# Patient Record
Sex: Female | Born: 1975 | Race: White | Hispanic: No | Marital: Married | Smoking: Never smoker
Health system: Southern US, Community
[De-identification: ages and names within clinical notes are randomized; demographics above are authoritative.]

## PROBLEM LIST (undated history)

## (undated) HISTORY — PX: CHOLECYSTECTOMY: SHX55

---

## 1997-07-28 ENCOUNTER — Other Ambulatory Visit: Admission: RE | Admit: 1997-07-28 | Discharge: 1997-07-28 | Payer: Self-pay | Admitting: Obstetrics and Gynecology

## 1998-12-19 ENCOUNTER — Other Ambulatory Visit: Admission: RE | Admit: 1998-12-19 | Discharge: 1998-12-19 | Payer: Self-pay | Admitting: Obstetrics and Gynecology

## 1999-01-18 ENCOUNTER — Encounter (INDEPENDENT_AMBULATORY_CARE_PROVIDER_SITE_OTHER): Payer: Self-pay | Admitting: Specialist

## 1999-01-18 ENCOUNTER — Other Ambulatory Visit: Admission: RE | Admit: 1999-01-18 | Discharge: 1999-01-18 | Payer: Self-pay | Admitting: Obstetrics and Gynecology

## 1999-06-20 ENCOUNTER — Other Ambulatory Visit: Admission: RE | Admit: 1999-06-20 | Discharge: 1999-06-20 | Payer: Self-pay | Admitting: Obstetrics and Gynecology

## 1999-10-08 DIAGNOSIS — K219 Gastro-esophageal reflux disease without esophagitis: Secondary | ICD-10-CM

## 1999-10-08 DIAGNOSIS — Z9189 Other specified personal risk factors, not elsewhere classified: Secondary | ICD-10-CM | POA: Insufficient documentation

## 2000-09-30 ENCOUNTER — Inpatient Hospital Stay (HOSPITAL_COMMUNITY): Admission: AD | Admit: 2000-09-30 | Discharge: 2000-10-02 | Payer: Self-pay | Admitting: Obstetrics and Gynecology

## 2000-10-05 ENCOUNTER — Encounter: Admission: RE | Admit: 2000-10-05 | Discharge: 2000-10-14 | Payer: Self-pay | Admitting: Obstetrics and Gynecology

## 2000-11-02 ENCOUNTER — Other Ambulatory Visit: Admission: RE | Admit: 2000-11-02 | Discharge: 2000-11-02 | Payer: Self-pay | Admitting: Obstetrics and Gynecology

## 2002-04-05 ENCOUNTER — Inpatient Hospital Stay (HOSPITAL_COMMUNITY): Admission: AD | Admit: 2002-04-05 | Discharge: 2002-04-07 | Payer: Self-pay | Admitting: Obstetrics and Gynecology

## 2002-05-05 ENCOUNTER — Other Ambulatory Visit: Admission: RE | Admit: 2002-05-05 | Discharge: 2002-05-05 | Payer: Self-pay | Admitting: Obstetrics and Gynecology

## 2003-06-21 ENCOUNTER — Other Ambulatory Visit: Admission: RE | Admit: 2003-06-21 | Discharge: 2003-06-21 | Payer: Self-pay | Admitting: Obstetrics and Gynecology

## 2004-07-30 ENCOUNTER — Other Ambulatory Visit: Admission: RE | Admit: 2004-07-30 | Discharge: 2004-07-30 | Payer: Self-pay | Admitting: Obstetrics and Gynecology

## 2005-09-23 ENCOUNTER — Ambulatory Visit (HOSPITAL_COMMUNITY): Admission: RE | Admit: 2005-09-23 | Discharge: 2005-09-23 | Payer: Self-pay | Admitting: Obstetrics and Gynecology

## 2006-01-13 ENCOUNTER — Inpatient Hospital Stay (HOSPITAL_COMMUNITY): Admission: AD | Admit: 2006-01-13 | Discharge: 2006-01-13 | Payer: Self-pay | Admitting: Obstetrics and Gynecology

## 2006-01-14 ENCOUNTER — Inpatient Hospital Stay (HOSPITAL_COMMUNITY): Admission: AD | Admit: 2006-01-14 | Discharge: 2006-01-14 | Payer: Self-pay | Admitting: Obstetrics and Gynecology

## 2006-01-30 ENCOUNTER — Inpatient Hospital Stay (HOSPITAL_COMMUNITY): Admission: AD | Admit: 2006-01-30 | Discharge: 2006-02-02 | Payer: Self-pay | Admitting: Obstetrics and Gynecology

## 2006-01-31 ENCOUNTER — Encounter (INDEPENDENT_AMBULATORY_CARE_PROVIDER_SITE_OTHER): Payer: Self-pay | Admitting: *Deleted

## 2006-02-03 ENCOUNTER — Encounter: Admission: RE | Admit: 2006-02-03 | Discharge: 2006-03-05 | Payer: Self-pay | Admitting: Obstetrics and Gynecology

## 2007-05-29 IMAGING — US US OB DETAIL+14 WK
1 series · 13 of 28 positions shown · non-contrast
Comparison: none

CLINICAL DATA: 15 week 3 day assigned gestational age by office ultrasound.  Probable cystic hygroma noted.  Evaluate fetal anatomy.

[Series 1: us ob detail+14 wk · 0.25mm/px · 13 of 100 slices shown]
[im 4/100]
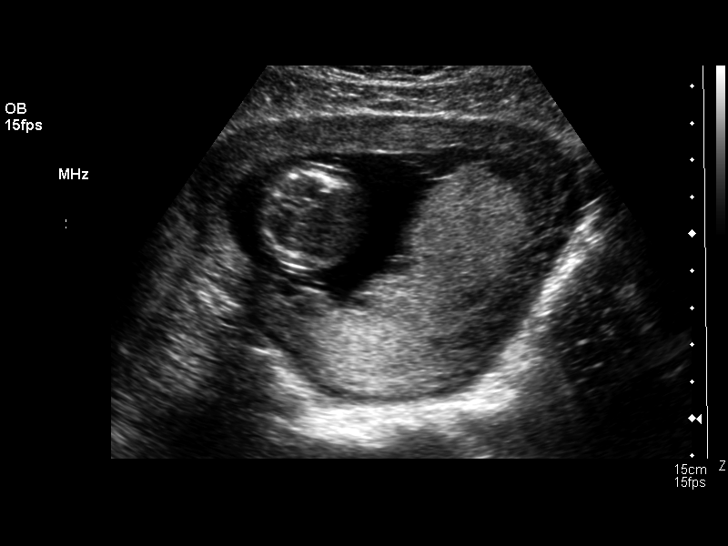
[im 12/100]
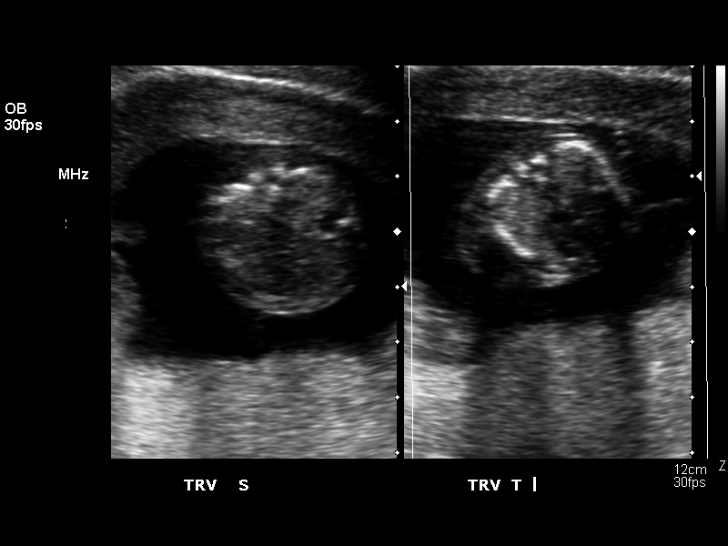
[im 19/100]
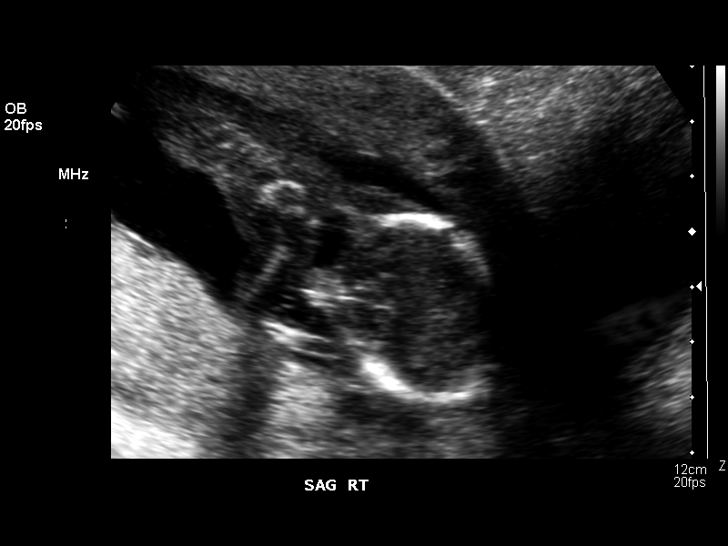
[im 26/100]
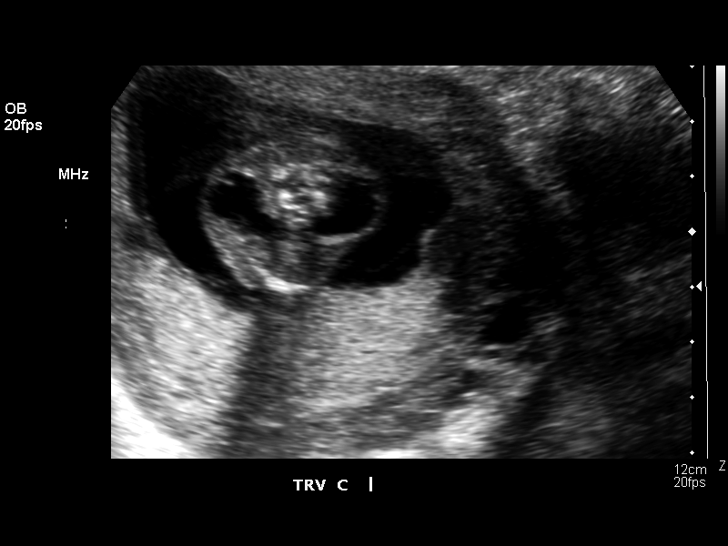
[im 34/100]
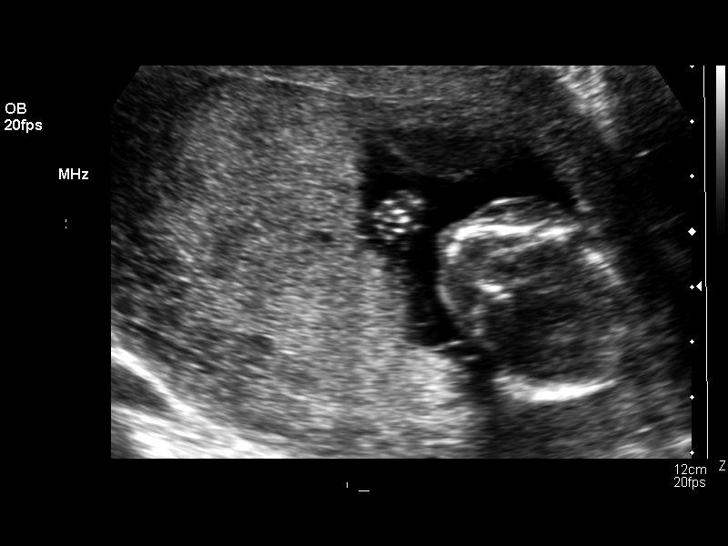
[im 41/100]
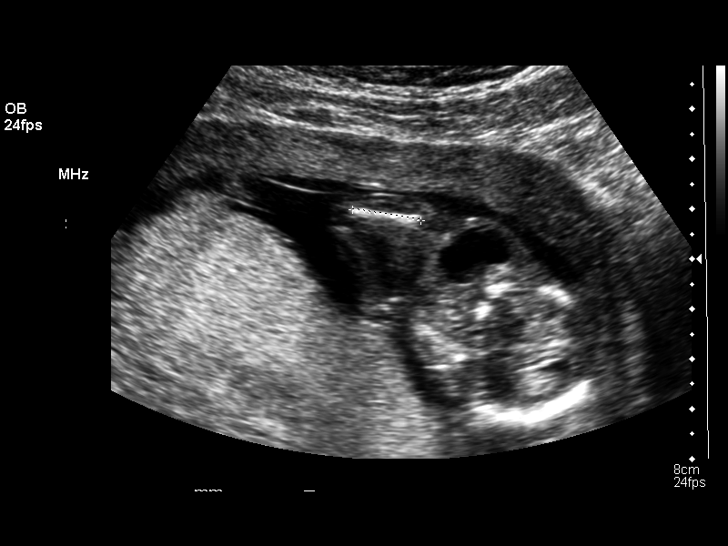
[im 52/100]
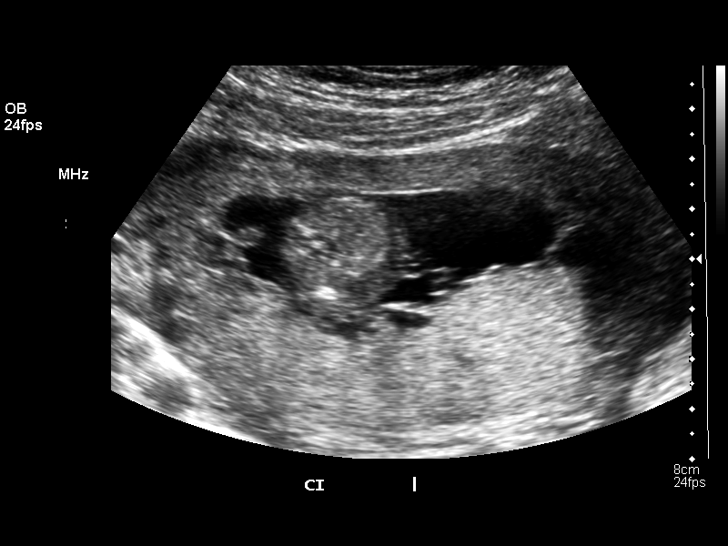
[im 59/100]
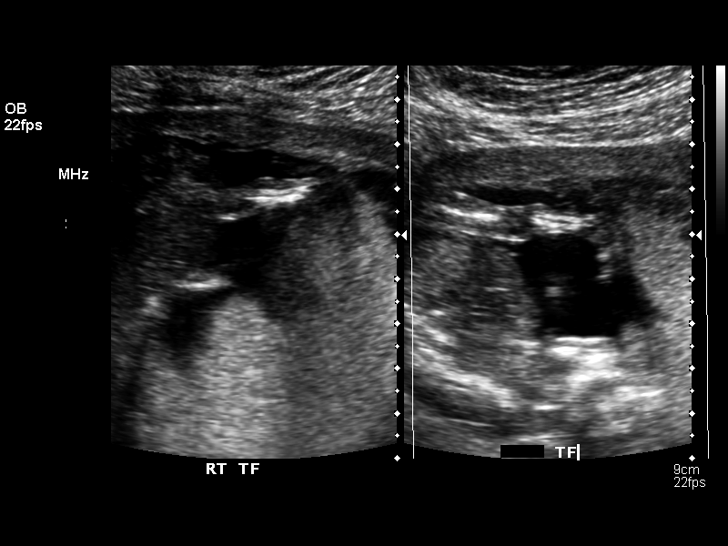
[im 67/100]
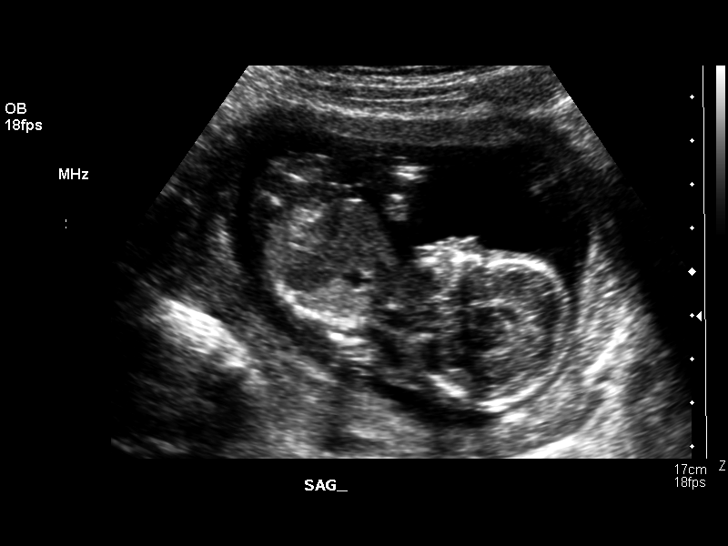
[im 74/100]
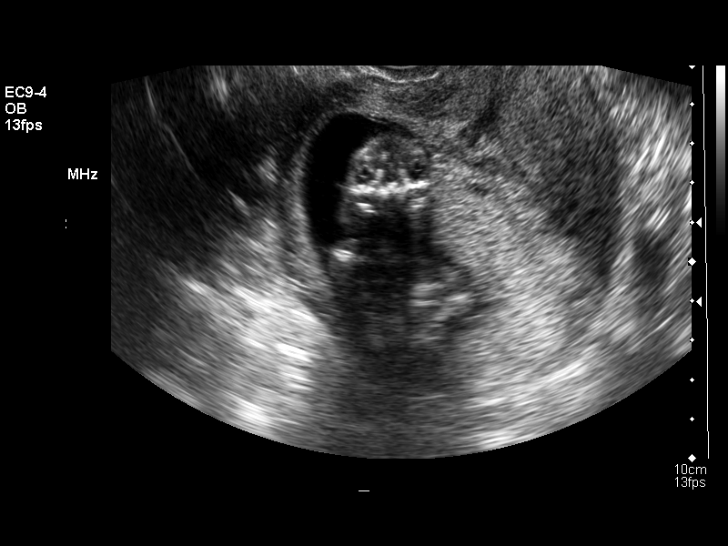
[im 81/100]
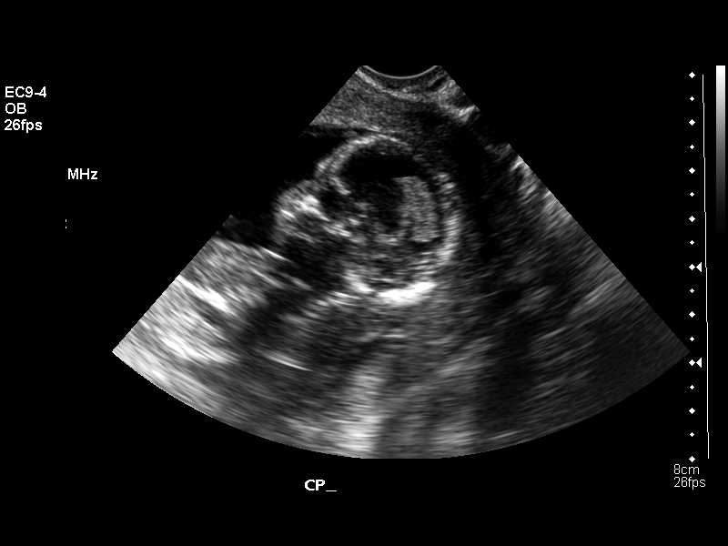
[im 89/100]
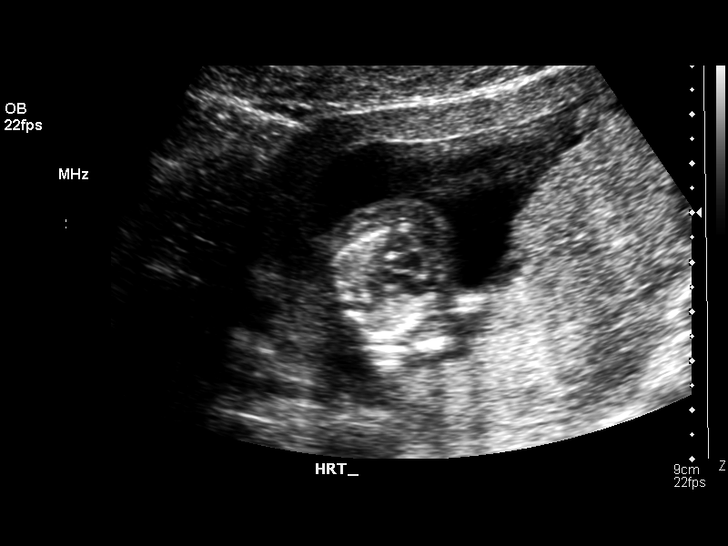
[im 96/100]
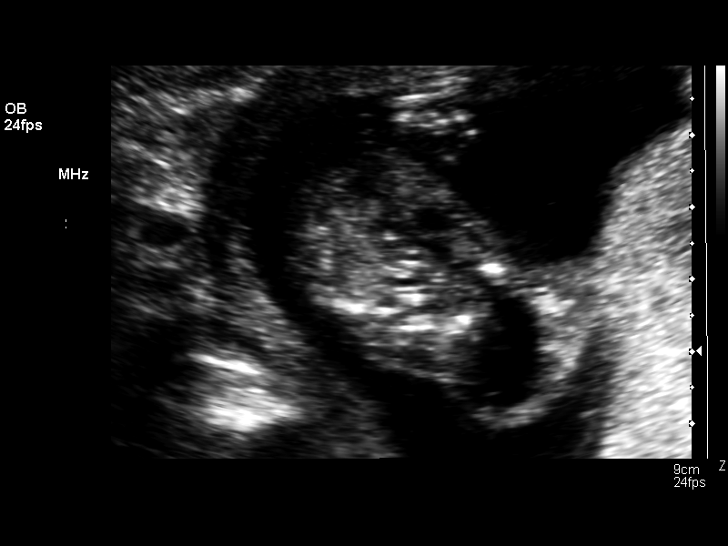

[13 of 28 positions shown; findings below may reference images not displayed]

DETAILED OBSTETRICAL ULTRASOUND:

Number of Fetuses: 1
Heart Rate:  133
Movement:  Yes
Breathing:  No
Presentation:  Cephalic
Placental Location:  Posterior
Grade:  0
Previa:  No
Amniotic Fluid (Subjective):  Normal
Amniotic Fluid (Objective):  3.2 cm Vertical pocket 

FETAL BIOMETRY
BPD:  2.9 cm   15 w 2 d
HC:  11.0 cm  15 w 2 d
AC:  8.7 cm  15 w 0 d 
FL:  1.4 cm  14 w 0 d
HL:  1.4 cm  13 w 6 d

MEAN GA:  14 w 5 d  US EDC:  03/19/06

FETAL ANATOMY
Lateral Ventricles:  Visualized  
Thalami/CSP:  Not visualized 
Posterior Fossa:  Not visualized 
Nuchal Region:  Abnormal; small cystic hygroma present 
Spine:  Limited
4 Chamber Heart on Left:  Limited; EIF noted
Stomach on Left:  Visualized 
3 Vessel Cord:  2-vessel cord visualized 
Cord Insertion Site:  Visualized 
Kidneys:  Visualized 
Bladder:  Visualized 
Extremities:  Visualized 

ADDITIONAL ANATOMY VISUALIZED:  Upper lip, profile, diaphragm, and 5th digit.
Comments:  A nasal bone is absent.  5th digit is visualized and middle phalanx is missing.  

MATERNAL UTERINE AND ADNEXAL FINDINGS
Cervix: 4.5 cm transabdominally
IMPRESSION: 1.  Assigned gestational age by prior office ultrasound is currently 15 weeks 3 days.  Today?s ultrasound measurements are concordant and indicate appropriate growth.
2.  Multiple early fetal abnormalities noted including a small cystic hygroma, absent nasal bone, missing middle phalanx of the 5th finger, 2-vessel umbilical cord, and echogenic intracardiac focus.   These findings are almost certainly due to underlying aneuploidy, most likely Turner syndrome or Down syndrome; amniocentesis is recommended.  These findings were discussed with Dr. Nanah Akuah by telephone and patient returned to his office following this examination.

## 2007-06-24 ENCOUNTER — Inpatient Hospital Stay (HOSPITAL_COMMUNITY): Admission: RE | Admit: 2007-06-24 | Discharge: 2007-06-25 | Payer: Self-pay | Admitting: Obstetrics and Gynecology

## 2008-04-18 ENCOUNTER — Emergency Department (HOSPITAL_BASED_OUTPATIENT_CLINIC_OR_DEPARTMENT_OTHER): Admission: EM | Admit: 2008-04-18 | Discharge: 2008-04-18 | Payer: Self-pay | Admitting: Emergency Medicine

## 2008-04-19 ENCOUNTER — Ambulatory Visit (HOSPITAL_BASED_OUTPATIENT_CLINIC_OR_DEPARTMENT_OTHER): Admission: RE | Admit: 2008-04-19 | Discharge: 2008-04-19 | Payer: Self-pay | Admitting: Emergency Medicine

## 2008-04-19 ENCOUNTER — Encounter: Payer: Self-pay | Admitting: Gastroenterology

## 2008-04-19 ENCOUNTER — Ambulatory Visit: Payer: Self-pay | Admitting: Diagnostic Radiology

## 2008-05-15 ENCOUNTER — Telehealth: Payer: Self-pay | Admitting: Gastroenterology

## 2008-06-27 ENCOUNTER — Other Ambulatory Visit: Payer: Self-pay | Admitting: Emergency Medicine

## 2008-06-27 ENCOUNTER — Inpatient Hospital Stay (HOSPITAL_COMMUNITY): Admission: EM | Admit: 2008-06-27 | Discharge: 2008-06-30 | Payer: Self-pay | Admitting: Surgery

## 2008-06-28 ENCOUNTER — Encounter: Payer: Self-pay | Admitting: General Surgery

## 2008-06-28 ENCOUNTER — Encounter (INDEPENDENT_AMBULATORY_CARE_PROVIDER_SITE_OTHER): Payer: Self-pay | Admitting: Surgery

## 2009-03-01 ENCOUNTER — Ambulatory Visit (HOSPITAL_COMMUNITY): Admission: RE | Admit: 2009-03-01 | Discharge: 2009-03-01 | Payer: Self-pay | Admitting: Obstetrics and Gynecology

## 2009-12-23 IMAGING — US US ABDOMEN COMPLETE
1 series · 14 of 25 positions shown · non-contrast
Comparison: None

CLINICAL DATA: Pain.

ABDOMEN ULTRASOUND
TECHNIQUE: Complete abdominal ultrasound examination was performed
including evaluation of the liver, gallbladder, bile ducts,
pancreas, kidneys, spleen, IVC, and abdominal aorta.

[Series 1: us abdomen complete · 0.28mm/px · 14 of 71 slices shown]
[im 1/71]
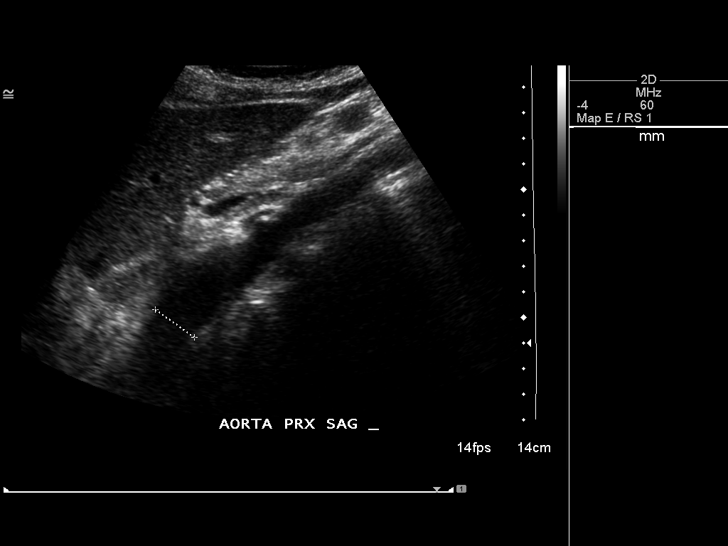
[im 6/71]
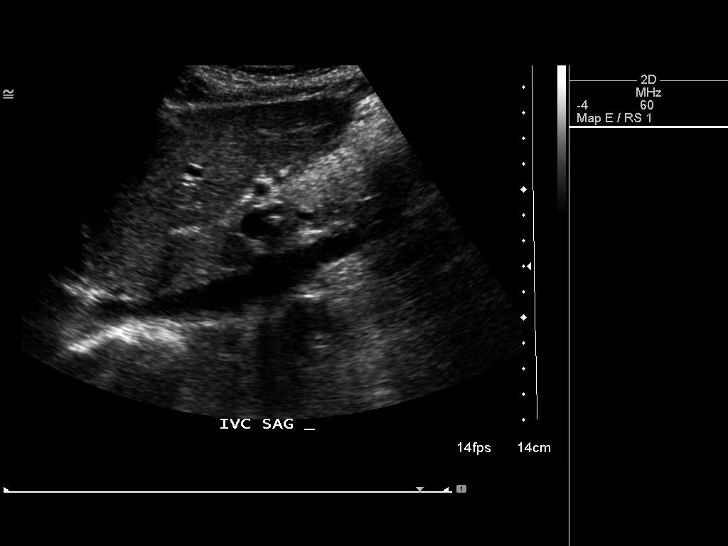
[im 12/71]
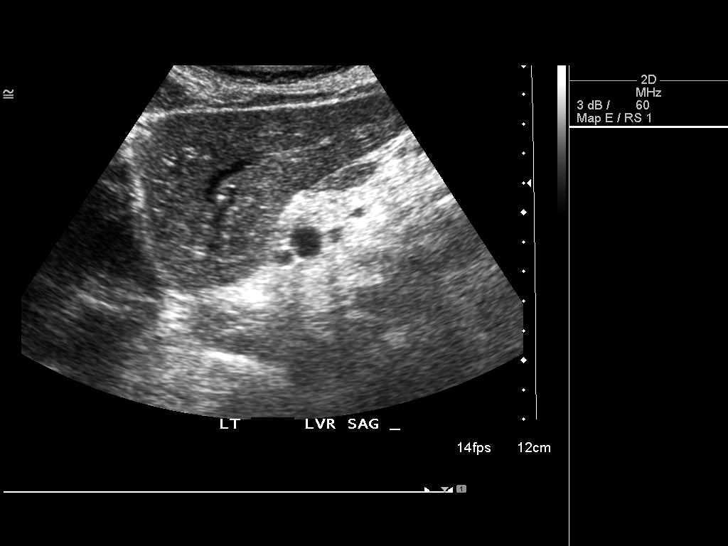
[im 18/71]
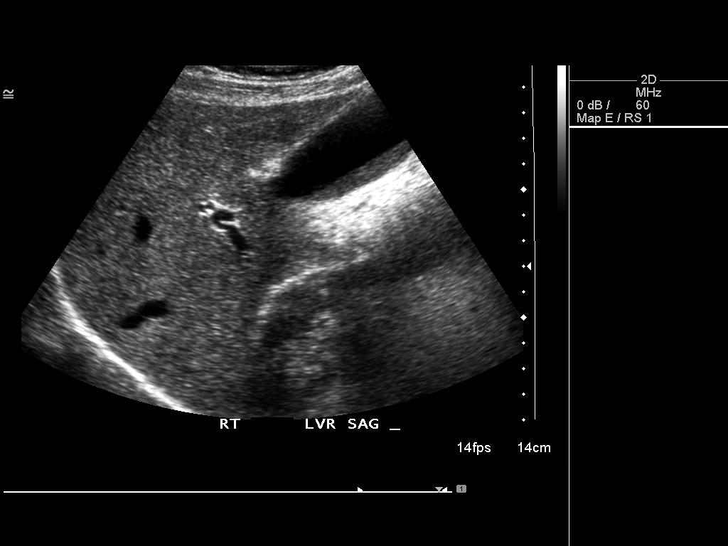
[im 24/71]
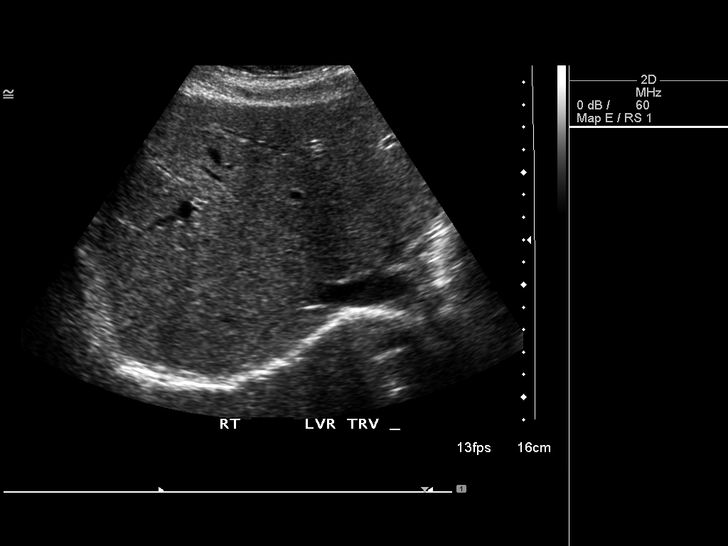
[im 27/71]
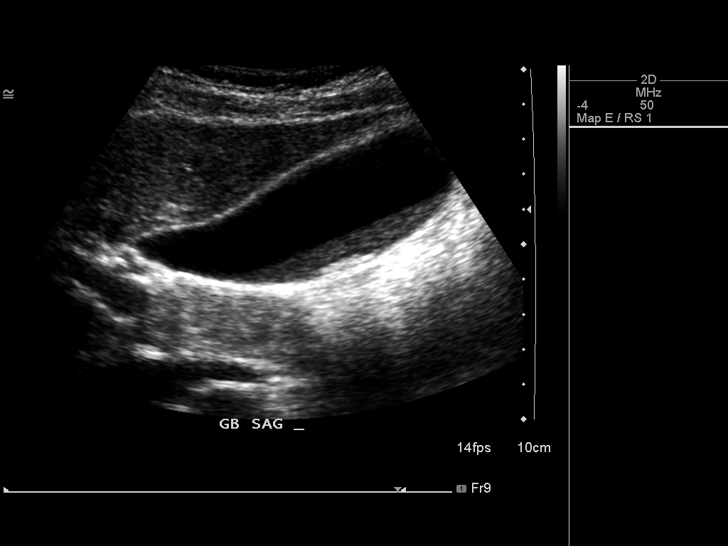
[im 33/71]
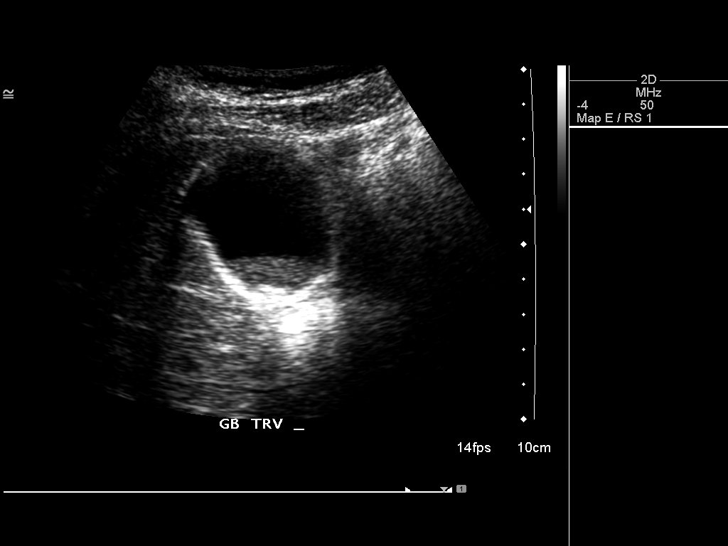
[im 38/71]
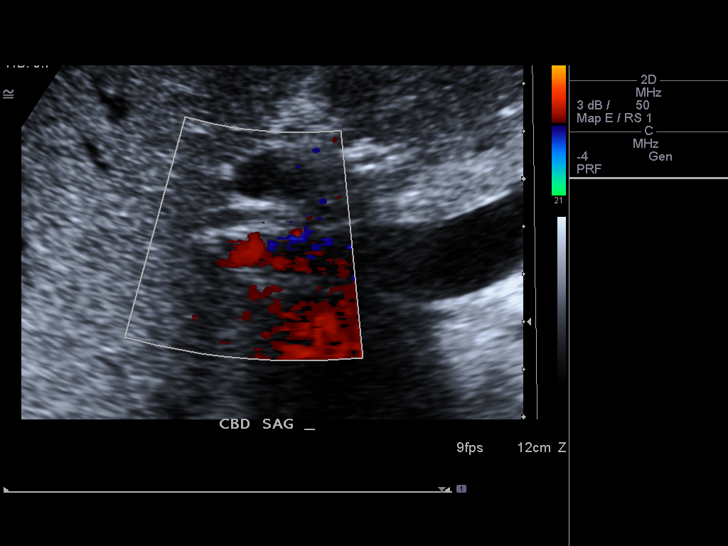
[im 44/71]
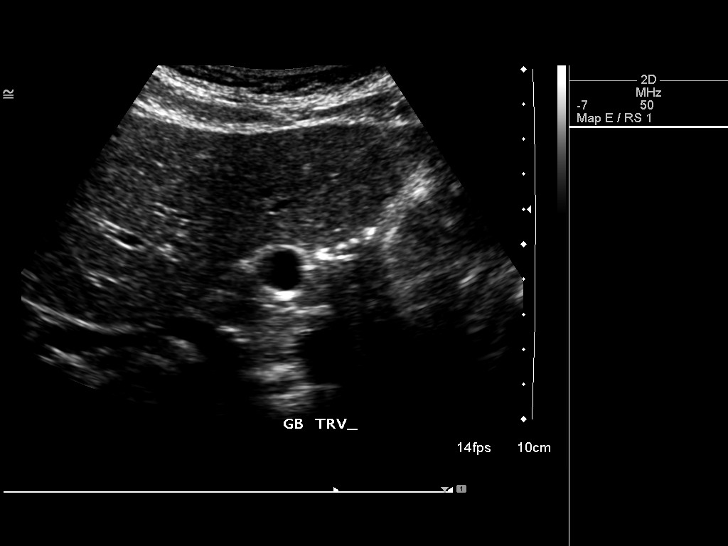
[im 47/71]
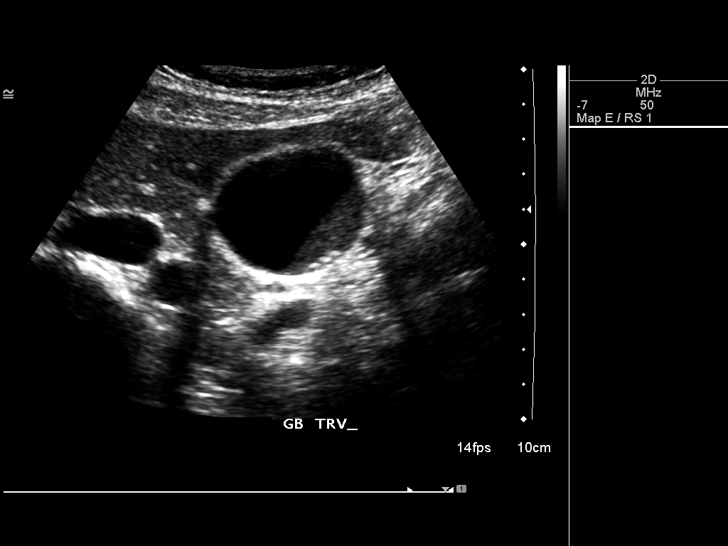
[im 53/71]
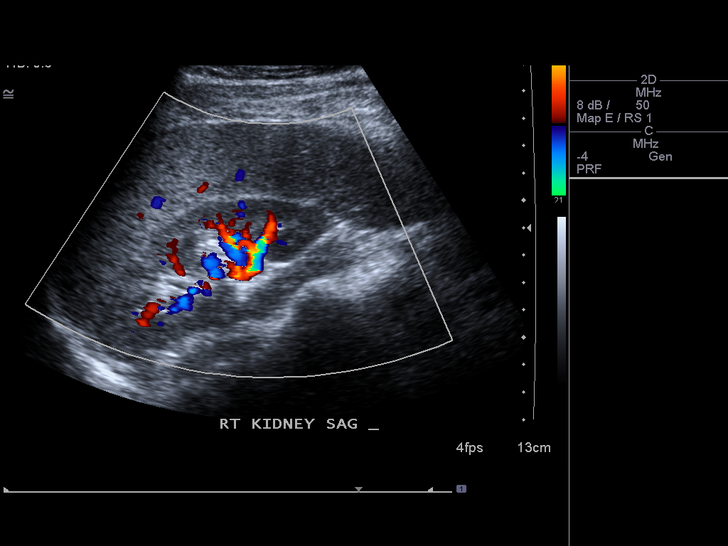
[im 59/71]
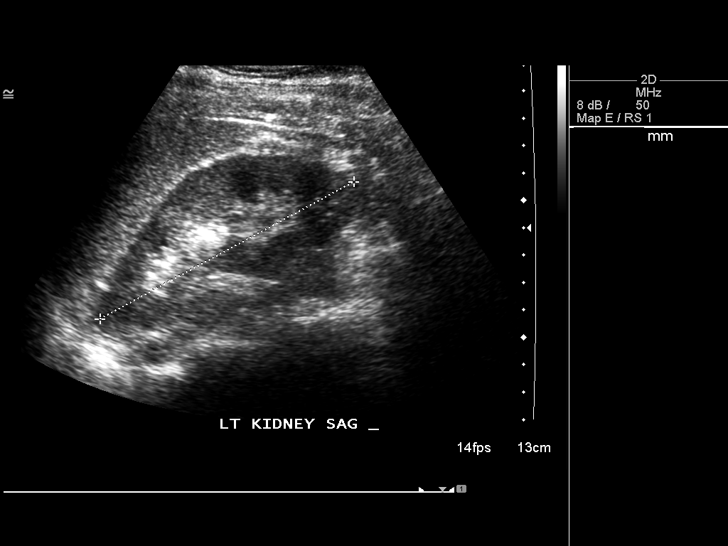
[im 65/71]
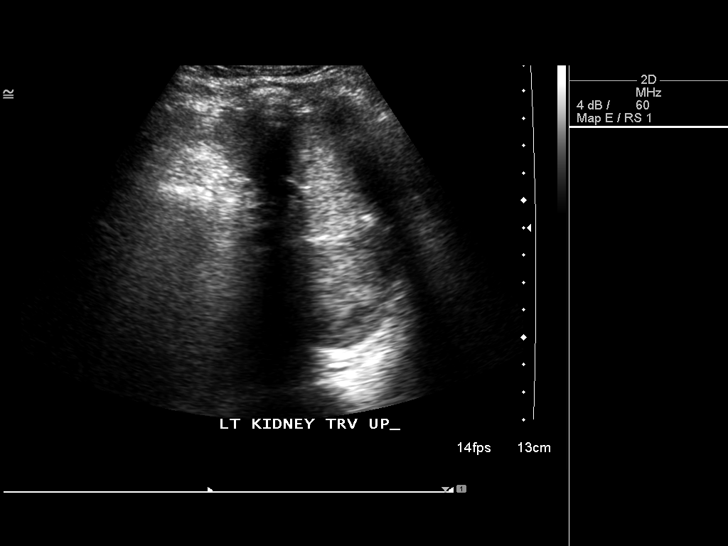
[im 71/71]
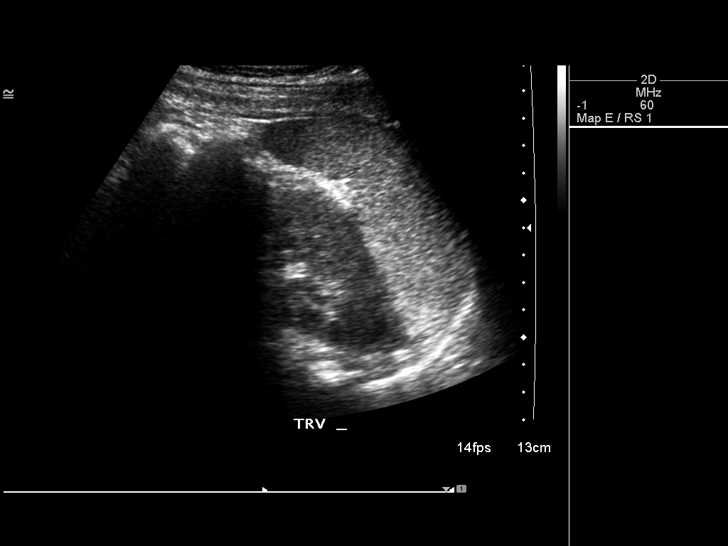

[14 of 25 positions shown; findings below may reference images not displayed]

FINDINGS: Sludge and small stones noted within the gallbladder.  No
evidence of wall thickening.  Common bile duct normal at 4 mm.  No
pericholecystic fluid.

Visualized liver, IVC, pancreas, spleen, kidneys, abdominal aorta
unremarkable.
IMPRESSION: Sludge and small stones in the gallbladder.  No sonographic changes
of acute cholecystitis.

REF:W2 DICTATED: 04/19/2008 [DATE]

## 2010-03-04 IMAGING — RF DG ERCP WO/W SPHINCTEROTOMY
1 series · 2 of 2 positions shown · IV contrast (agent unspecified)
Comparison: 06/28/2008.

CLINICAL DATA: Cholecystitis.

ERCP
Fluoroscopy Time: For 0.55 minutes.
Contrast: Not specified.

[Series 1: run · 2 of 2 slices shown]
[im 1/2]
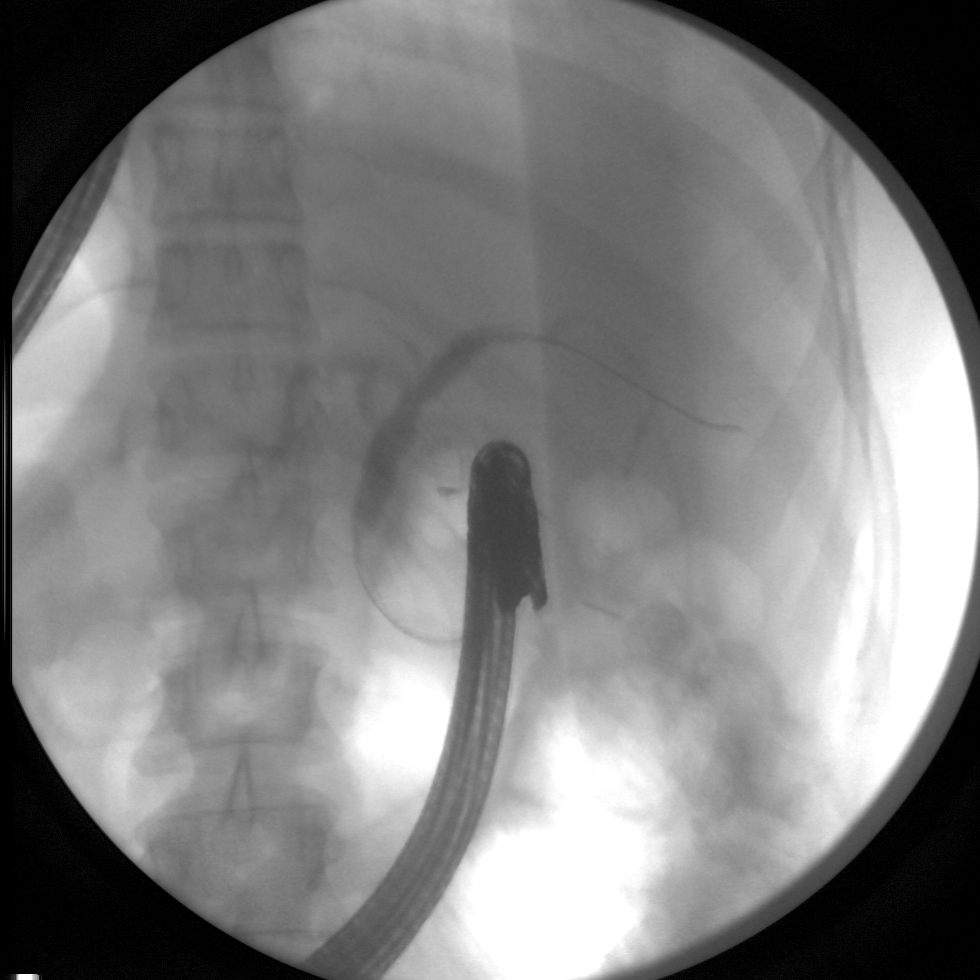
[im 2/2]
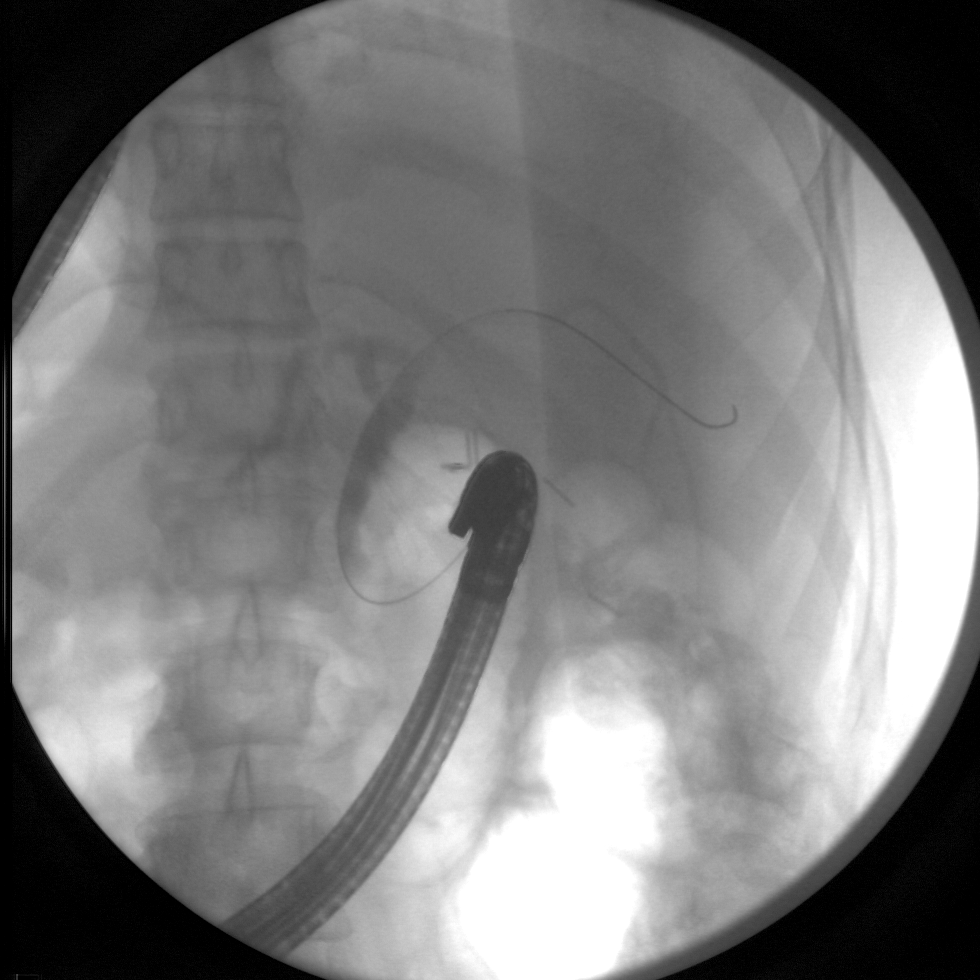

[2 of 2 positions shown; findings below may reference images not displayed]

FINDINGS: 2 images from ERCP submitted for review after procedure.
Radiologist was not present and this was not discussed with a
radiologist.  Partial filling of the intrahepatic biliary ducts,
common hepatic duct and common bile duct.
IMPRESSION: Limited exam with partial filling of bile ducts as noted above.

## 2010-06-18 LAB — CBC
HCT: 39.9 % (ref 36.0–46.0)
Hemoglobin: 13.4 g/dL (ref 12.0–15.0)
RBC: 4.36 MIL/uL (ref 3.87–5.11)
RDW: 12.8 % (ref 11.5–15.5)

## 2010-06-26 LAB — CBC
HCT: 39 % (ref 36.0–46.0)
Hemoglobin: 13.5 g/dL (ref 12.0–15.0)
Hemoglobin: 11.8 g/dL — ABNORMAL LOW (ref 12.0–15.0)
MCHC: 34.9 g/dL (ref 30.0–36.0)
MCHC: 34.2 g/dL (ref 30.0–36.0)
MCV: 91.3 fL (ref 78.0–100.0)
MCV: 90.6 fL (ref 78.0–100.0)
MCV: 91.9 fL (ref 78.0–100.0)
Platelets: 273 10*3/uL (ref 150–400)
Platelets: 185 10*3/uL (ref 150–400)
RBC: 4.3 MIL/uL (ref 3.87–5.11)
RBC: 3.62 MIL/uL — ABNORMAL LOW (ref 3.87–5.11)
RBC: 3.75 MIL/uL — ABNORMAL LOW (ref 3.87–5.11)
RDW: 13.5 % (ref 11.5–15.5)
RDW: 14 % (ref 11.5–15.5)
WBC: 10.5 10*3/uL (ref 4.0–10.5)
WBC: 5.7 10*3/uL (ref 4.0–10.5)

## 2010-06-26 LAB — COMPREHENSIVE METABOLIC PANEL
ALT: 153 U/L — ABNORMAL HIGH (ref 0–35)
ALT: 350 U/L — ABNORMAL HIGH (ref 0–35)
ALT: 238 U/L — ABNORMAL HIGH (ref 0–35)
AST: 36 U/L (ref 0–37)
AST: 196 U/L — ABNORMAL HIGH (ref 0–37)
Albumin: 3.1 g/dL — ABNORMAL LOW (ref 3.5–5.2)
Albumin: 3.1 g/dL — ABNORMAL LOW (ref 3.5–5.2)
Alkaline Phosphatase: 70 U/L (ref 39–117)
Alkaline Phosphatase: 125 U/L — ABNORMAL HIGH (ref 39–117)
Alkaline Phosphatase: 80 U/L (ref 39–117)
BUN: 4 mg/dL — ABNORMAL LOW (ref 6–23)
BUN: 5 mg/dL — ABNORMAL LOW (ref 6–23)
CO2: 26 mEq/L (ref 19–32)
Calcium: 8.6 mg/dL (ref 8.4–10.5)
Calcium: 8.6 mg/dL (ref 8.4–10.5)
Chloride: 104 mEq/L (ref 96–112)
Chloride: 108 mEq/L (ref 96–112)
Creatinine, Ser: 0.72 mg/dL (ref 0.4–1.2)
Creatinine, Ser: 0.77 mg/dL (ref 0.4–1.2)
Creatinine, Ser: 0.81 mg/dL (ref 0.4–1.2)
GFR calc Af Amer: >60 mL/min (ref >60)
GFR calc Af Amer: >60 mL/min (ref >60)
GFR calc non Af Amer: >60 mL/min (ref >60)
Potassium: 3.7 mEq/L (ref 3.5–5.1)
Sodium: 140 mEq/L (ref 135–145)
Total Bilirubin: 1.4 mg/dL — ABNORMAL HIGH (ref 0.3–1.2)
Total Bilirubin: 0.8 mg/dL (ref 0.3–1.2)
Total Protein: 5.2 g/dL — ABNORMAL LOW (ref 6.0–8.3)

## 2010-06-26 LAB — ABO/RH: ABO/RH(D): O POS

## 2010-06-26 LAB — DIFFERENTIAL
Basophils Absolute: 0 10*3/uL (ref 0.0–0.1)
Basophils Absolute: 0 10*3/uL (ref 0.0–0.1)
Basophils Relative: 0 % (ref 0–1)
Eosinophils Relative: 3 % (ref 0–5)
Eosinophils Relative: 0 % (ref 0–5)
Lymphocytes Relative: 10 % — ABNORMAL LOW (ref 12–46)
Lymphs Abs: 2.5 10*3/uL (ref 0.7–4.0)
Lymphs Abs: 1 10*3/uL (ref 0.7–4.0)
Monocytes Absolute: 0.5 10*3/uL (ref 0.1–1.0)
Monocytes Relative: 5 % (ref 3–12)
Neutro Abs: 9 10*3/uL — ABNORMAL HIGH (ref 1.7–7.7)
Neutrophils Relative %: 85 % — ABNORMAL HIGH (ref 43–77)

## 2010-06-26 LAB — POCT CARDIAC MARKERS

## 2010-07-02 LAB — DIFFERENTIAL
Basophils Absolute: 0.1 10*3/uL (ref 0.0–0.1)
Eosinophils Absolute: 0.1 10*3/uL (ref 0.0–0.7)
Eosinophils Relative: 1 % (ref 0–5)
Lymphocytes Relative: 42 % (ref 12–46)
Monocytes Absolute: 0.5 10*3/uL (ref 0.1–1.0)
Neutro Abs: 5.6 10*3/uL (ref 1.7–7.7)
Neutrophils Relative %: 51 % (ref 43–77)

## 2010-07-02 LAB — CBC
MCHC: 34.2 g/dL (ref 30.0–36.0)
MCV: 90.5 fL (ref 78.0–100.0)
RBC: 4.44 MIL/uL (ref 3.87–5.11)

## 2010-07-02 LAB — COMPREHENSIVE METABOLIC PANEL
AST: 66 U/L — ABNORMAL HIGH (ref 0–37)
Albumin: 4.8 g/dL (ref 3.5–5.2)
Alkaline Phosphatase: 85 U/L (ref 39–117)
CO2: 22 mEq/L (ref 19–32)
Chloride: 105 mEq/L (ref 96–112)
Creatinine, Ser: 0.8 mg/dL (ref 0.4–1.2)
GFR calc Af Amer: >60 mL/min (ref >60)
Glucose, Bld: 107 mg/dL — ABNORMAL HIGH (ref 70–99)
Sodium: 140 mEq/L (ref 135–145)

## 2010-07-02 LAB — URINALYSIS, ROUTINE W REFLEX MICROSCOPIC
Ketones, ur: NEGATIVE mg/dL
Protein, ur: NEGATIVE mg/dL
Urobilinogen, UA: 0.2 mg/dL (ref 0.0–1.0)

## 2010-07-02 LAB — LIPASE, BLOOD: Lipase: 135 U/L (ref 23–300)

## 2010-07-02 LAB — URINE MICROSCOPIC-ADD ON

## 2010-07-30 NOTE — H&P (Signed)
NAMERUE, VALLADARES              ACCOUNT NO.:  [id number]   MEDICAL RECORD NO.:  [id number]          PATIENT TYPE:  INP   LOCATION:  5522                         FACILITY:  [facility]   PHYSICIAN:  Velora Heckler, MD      DATE OF BIRTH:  09/10/1975   DATE OF ADMISSION:  06/27/2008  DATE OF DISCHARGE:                              HISTORY & PHYSICAL   CHIEF COMPLAINT:  Biliary colic.   HISTORY OF PRESENT ILLNESS:  Ms. Barga is a 35 year old otherwise  healthy female patient with known cholelithiasis.  She was evaluated by  Dr. Emelia Loron on April 24, 2008 with plans to undergo  laparoscopic cholecystectomy next month.  She has known cholelithiasis  without evidence of cholecystitis on ultrasound from February.  Since  her evaluation by Dr. Dwain Sarna she has had at least 3-4 episodes of  biliary colic sustained less than a few hours.  She was treated this  with the Percocet that she was given.  She had a very severe episode  yesterday evening that began about 9:30 and lasted through the night.  Because of this, she of presented to the [facility] Location of the [facility]  ER where she was reevaluated.  White count was normal with elevated  neutrophil count.  Her lipase was 124, total bilirubin 1.4, alkaline  phosphatase 125, AST 534, and ALT 350.  Please note that in February,  her AST and ALT were only mildly elevated.  Her white count was 10,500.  Because of her severity of pain and worsening transaminitis, she was  transferred from the [facility] Location of the [facility] ER for direct  admission to [facility] for consideration for laparoscopic cholecystectomy.   REVIEW OF SYSTEMS:  As per the history of present illness, nothing has  changed since her previous H and P dictated from the office on April 24, 2008 except for the episodic biliary colic with worst episode in the  past 24 hours.   SOCIAL HISTORY:  She is a Manufacturing systems engineer.  No alcohol or tobacco.  She has 4 small children.   She is married.  Her husband works in [place] and works with a Teacher, adult education for cardiac Therapist, sports.   FAMILY HISTORY:  Noncontributory.   PAST MEDICAL HISTORY:  Reported as GERD, and peptic ulcer disease and  followed by Gastroenterology.   PAST SURGICAL HISTORY:  Bilateral knee surgery and prior EGD.   PHYSICAL EXAMINATION:  GENERAL:  Pleasant female patient who was  awakened from sleeping, who reports improvement in her abdominal pain as  compared to yesterday evening.  VITAL SIGNS:  Temperature 98.7, BP 104/60, pulse 47 and regular,  respirations 20.  PSYCHIATRY:  The patient is alert and oriented x3 when aroused.  Affect  appropriate to current situation.  NEUROLOGIC:  Cranial nerves II through XII are grossly intact.  No focal  neurological deficits.  Extremities, move x4.  EYES:  Eyes are slightly injected, but nonicteric.  EARS, NOSE AND THROAT:  Ears are symmetrical.  No otorrhea.  Nose is  midline.  No rhinorrhea.  Oral  mucous membranes are pink and moist.  CHEST:  Bilateral lung sounds are clear to auscultation.  Respiratory  effort is nonlabored.  She is on room air saturating 99%.  CARDIOVASCULAR:  Heart sounds S1, S2.  No rubs, murmurs, thrills, or  gallops.  No JVD.  Pulses regular.  No peripheral edema.  No  tachycardia.  ABDOMEN:  Soft, mildly tender in the right upper quadrant, nondistended.  Bowel sounds are present and normoactive.  EXTREMITIES:  Symmetrical in appearance without cyanosis or clubbing.   LABORATORY DATA:  Lipase 124.  Sodium 140, potassium 3.7, CO2 27,  glucose 123, BUN 11, creatinine 0.7, total bilirubin 1.4, alkaline  phosphatase 125, AST 534, ALT 350.  White count 10,500, neutrophils 85%,  hemoglobin 31.5, platelets 273,000.   IMPRESSION:  1. Biliary colic.  2. Cholelithiasis with transaminitis, worrisome for possible      choledocholithiasis.   PLAN:  1. Admit the patient to the general surgical floor, clear  liquid diet      today, then n.p.o. after midnight.  Symptomatic treatment with use      of IV narcotics, p.o. narcotics, and IV antiemetics.  2. Empiric Cipro and follow up on labs in the morning.  3. If LFTs are worsening especially if total bilirubin increases,      consideration must be given for choledocholithiasis.  Therefore, a      GI consult for possible ERCP.  4. If LFTs are trending downward, then probably will be okay to      proceed with laparoscopic cholecystectomy hopefully tomorrow.  Dr.      [name] will notify Dr. Dwain Sarna of admission.  5. DVT prophylaxis with SCD hose, no indications for need for      medication therapy at this time.      Allison L. Rennis Harding, N.P.      Velora Heckler, MD  Electronically Signed    ALE/MEDQ  D:  06/27/2008  T:  06/28/2008  Job:  [id number]   cc:   Juanetta Gosling, MD

## 2010-07-30 NOTE — Op Note (Signed)
Judy Hinton, Judy Hinton              ACCOUNT NO.:  [id number]   MEDICAL RECORD NO.:  [id number]          PATIENT TYPE:  AMB   LOCATION:  DAY                          FACILITY:  [facility]   PHYSICIAN:  Velora Heckler, MD      DATE OF BIRTH:  Jul 13, 1975   DATE OF PROCEDURE:  06/28/2008  DATE OF DISCHARGE:                               OPERATIVE REPORT   PREOPERATIVE DIAGNOSES:  Chronic cholecystitis, cholelithiasis, and  choledocholithiasis.   POSTOPERATIVE DIAGNOSES:  Chronic cholecystitis, cholelithiasis, and  choledocholithiasis.   PROCEDURE:  Laparoscopic cholecystectomy with intraoperative  cholangiography.   SURGEON:  Velora Heckler, MD, FACS   ANESTHESIA:  General.   ESTIMATED BLOOD LOSS:  Minimal.   PREPARATION:  ChloraPrep.   COMPLICATIONS:  None.   INDICATIONS:  The patient is a 35 year old white female seen originally  in February 2010 by Dr. Emelia Loron for cholelithiasis.  The  patient has had several episodes of biliary colic in the interim.  She  presented to the emergency department with abdominal pain.  Laboratory  tests showed elevated liver function tests.  The patient was admitted on  the Surgical Service.  Liver function test improved and she was prepared  for the operating room.   BODY OF REPORT:  Procedure was done in OR #17 at the [facility].  The patient was brought to the operating room and  placed in supine position on the operating room table.  Following  administration of general anesthesia, the patient was positioned and  then prepped and draped in the usual strict aseptic fashion.  After  ascertaining that an adequate level of anesthesia had been achieved, a  infraumbilical incision was made in the midline with a #15 blade.  Dissection was carried down through the subcutaneous tissues.  Fascia  was incised in the midline and the peritoneal cavity was entered  cautiously.  A 0-Vicryl purse-string suture was placed in the  fascia and  Hasson cannula was introduced under direct vision and secured with a  purse-string suture.  Abdomen was insufflated with carbon dioxide.  Laparoscope was introduced and the abdomen explored.  Operative ports  were placed along the right costal margin in the midline, midclavicular  line, and anterior axillary line.  Fundus of the gallbladder was  identified and retracted cephalad.  There are dense omental adhesions to  the undersurface of the gallbladder.  There is edema.  There are signs  of acute inflammation.  Omental adhesions were taken down using blunt  dissection and hemostasis obtained with the electrocautery.  Gallbladder  was completely exposed.  Dissection was begun at the neck of the  gallbladder.  Cystic duct and cystic artery were both dissected out.  Cystic artery was divided between Ligaclips with the scissors.  The clip  was placed at the neck of the gallbladder.  The cystic duct was incised.  A Cook cholangiography catheter was then introduced through a stab wound  in the right upper quadrant.  It was inserted into the cystic duct and  secured with a Ligaclip.  Using C-arm fluoroscopy, Real-Time  cholangiography was performed.  There was rapid filling of a normal  caliber common bile duct.  There was reflux into the right and left  hepatic ductal system.  However, distally, there was no flow into the  duodenum.  There was no obvious filling defect but a suspicion of a  distal common bile duct stone was raised.  Clip was withdrawn and Cook  catheter was removed from the peritoneal cavity.  Cystic duct was doubly  clipped and divided.  Gallbladder was excised from the gallbladder bed  using the hook electrocautery for hemostasis.  A posterior branch of the  cystic artery was divided between Ligaclips.  Gallbladder was completely  excised and placed into an EndoCatch bag.  It was withdrawn through the  umbilical port without difficulty.  A 0-Vicryl purse-string  suture was  tied securely.  Abdomen was irrigated with warm saline which was  evacuated.  Good hemostasis was noted.  Ports were removed under direct  vision and good hemostasis was noted at all port sites.  Pneumoperitoneum was released.  Port sites were anesthetized with local  anesthetic.  Skin incisions were closed with interrupted 4-0 Vicryl  subcuticular sutures.  Wounds were washed and dried and benzoin and  Steri-Strips were applied.  Sterile dressings were applied.  The patient  was awakened from anesthesia and brought to the recovery room in stable  condition.  The patient tolerated the procedure well.      Velora Heckler, MD  Electronically Signed     TMG/MEDQ  D:  06/28/2008  T:  06/29/2008  Job:  [id number]   cc:   Juanetta Gosling, MD

## 2010-07-30 NOTE — Op Note (Signed)
Judy Hinton, Judy Hinton              ACCOUNT NO.:  [id number]   MEDICAL RECORD NO.:  [id number]          PATIENT TYPE:  INP   LOCATION:  5522                         FACILITY:  [facility]   PHYSICIAN:  Petra Kuba, M.D.    DATE OF BIRTH:  Aug 27, 1975   DATE OF PROCEDURE:  DATE OF DISCHARGE:                               OPERATIVE REPORT   PROCEDURE:  Endoscopic retrograde cholangiopancreatography,  sphincterotomy, and balloon pull-through.   INDICATION:  Probable CBD stent with abnormal intraoperative  cholangiogram and abnormal liver test, status post cholecystectomy.  Consent was signed after risks, benefits, methods, and options  thoroughly discussed by both yesterday and today with both the patient  and multiple family members.   MEDICINES:  1. Fentanyl 125 mcg.  2. Versed 8 mg.   PROCEDURE:  The side-viewing therapeutic video duodenoscope was inserted  by indirect vision into the stomach and advanced through a normal antrum  and pylorus, into the duodenum, and a normal-appearing ampulla was  brought into view.  Using the triple-lumen sphincterotome loaded with  the Jagwire, deep selective cannulation was obtained.  There was no PD  injections or wire advancements.  During the procedure, the CBD was  normal.  No obvious stones were seen.  We went ahead and proceeded with  a small-to-medium size sphincterotomy until we had adequate biliary  drainage and could get the fully bowed sphincterotome easily in and out  of the duct.  We then went ahead and exchanged the sphincterotome with  the adjustable 12 to 15 mm balloon and pulled the balloon through  multiple times at roughly 8 to 12 mm diameter.  There was few specs of  gravel that were removed but no frank stones.  We then proceeded with an  occlusion cholangiogram, which was normal without any residual stones.  We went ahead and pulled the balloon through one more time.  There was  adequate biliary drainage and we elected to stop  the procedure at this  junction.  The patient tolerated the procedure well and the scope was  removed.  There was no obvious immediate complication.   ENDOSCOPIC DIAGNOSES:  1. Normal ampulla.  2. No PD injections or wire advancement.  3. Normal common bile duct without any obvious stone seen.  4. Status post small sphincterotomy and multiple balloon pull-throughs      using a 12 to 15 mm adjustable balloon with minimal plaques of      debris being delivered.  5. Negative occlusion cholangiogram and adequate drainage at the end      of the procedure.   PLAN:  Observe for delayed complications, if none, possibly can go home  later today after dinner or in the a.m.  Recheck liver tests on follow  up, have to see back p.r.n.           ______________________________  Petra Kuba, M.D.     MEM/MEDQ  D:  06/29/2008  T:  06/30/2008  Job:  [id number]   cc:   Velora Heckler, MD  Malva Limes, M.D.

## 2010-07-30 NOTE — Consult Note (Signed)
NAMEMARCHA, LICKLIDER              ACCOUNT NO.:  [id number]   MEDICAL RECORD NO.:  [id number]          PATIENT TYPE:  INP   LOCATION:  5522                         FACILITY:  [facility]   PHYSICIAN:  Petra Kuba, M.D.    DATE OF BIRTH:  05-Jan-1976   DATE OF CONSULTATION:  06/28/2008  DATE OF DISCHARGE:                                 CONSULTATION   We were asked to see Judy Hinton today in consultation for common bile  duct obstruction by Dr. [name] of the [facility] Surgery Service.   HISTORY OF PRESENT ILLNESS:  This is a very pleasant 35 year old healthy  female with symptomatic gallstones who is status post cholecystectomy  today.  She was found to have distal common bile duct obstruction on  IOC.  The patient reports her first gallbladder attack was in February  and then she began having them approximately every other week.  She did  notice that they were associated with eating fatty foods.  She has no  chronic illnesses.   The risks of sedation, perforation, bleeding, and pancreatitis were  discussed with the patient, her husband, and her mother present.  The  patient understands these risks and consented to the procedure.   PAST MEDICAL HISTORY:  Significant for remote history of peptic ulcer  disease as well as GERD.  She says that her GERD stopped after she  experienced pregnancy.  She has had bilateral knee surgeries.   CURRENT MEDICATIONS:  Percocet and Reglan.   She has no known drug allergies.   REVIEW OF SYSTEMS:  Negative except per HPI.   SOCIAL HISTORY:  Negative for drugs, alcohol, and tobacco.  She does  have 4 children and is married.   FAMILY HISTORY:  Significant for her mother with gallbladder disease and  her great maternal aunt with colon cancer.   PHYSICAL EXAMINATION:  GENERAL:  She is alert and oriented in no  apparent distress after surgery.  VITAL SIGNS:  Temperature is 98.8, pulse 58, respirations 18, and blood  pressure is 106/65.  ABDOMEN:  Soft and nondistended with no bowel sounds presently.  She was  not tender to mild palpation or my auscultation.   LABORATORY DATA:  Hemoglobin of 11.4, hematocrit 33.3, white count 5.7,  and platelets 189.  Her LFTs today are as follows:  Alk phos 80, total  bilirubin 0.8, AST 196, and ALT 359.  Her BMET is within normal limits.   RADIOLOGICAL EXAMS:  An abdominal ultrasound done April 19, 2008,  which showed cholelithiasis without cholecystitis.  Also today, June 28, 2008, she had an intraoperative cholangiogram that showed distal  common bile duct obstruction suspicious for several small stones.  No  large obstructing calculi.   ASSESSMENT:  Dr. Vida Rigger has seen and examined the patient, collected  a history, and reviewed her chart.  His impression is this is a very  pleasant female with a common bile duct obstruction.   PLAN:  Scheduled for ERCP, April 15 at 9:00 a.m.  Thanks very much for  this consultation.      Stephani Police,  PA    ______________________________  Petra Kuba, M.D.    MLY/MEDQ  D:  06/28/2008  T:  06/29/2008  Job:  [id number]   cc:   Velora Heckler, MD  Juanetta Gosling, MD

## 2010-07-30 NOTE — Discharge Summary (Signed)
Judy Hinton              ACCOUNT NO.:  [id number]   MEDICAL RECORD NO.:  [id number]          PATIENT TYPE:  INP   LOCATION:  5522                         FACILITY:  [facility]   PHYSICIAN:  Velora Heckler, MD      DATE OF BIRTH:  Jan 23, 1976   DATE OF ADMISSION:  06/27/2008  DATE OF DISCHARGE:  06/30/2008                               DISCHARGE SUMMARY   PROCEDURES:  1. Laparoscopic cholecystectomy with intraoperative cholangiogram by      Dr. [name] on June 28, 2008.  2. Endoscopic retrograde cholangiopancreatography by Dr. Ewing Schlein on      June 29, 2008.   CONSULTANTS:  Petra Kuba, MD, with Gastroenterology   REASON FOR ADMISSION:  Judy Hinton is a 35 year old female with known  cholelithiasis and biliary colic, who was evaluated by Dr. Dwain Sarna in  our office with plans for laparoscopic cholecystectomy in May.  However,  since that evaluation, she has had 3-4 episodes of biliary colic with a  severe episode the night prior to admission.  Because of this, she  presented to the emergency department where she was found to have  slightly elevated total bilirubin and some transaminitis.  Because of  this, she was admitted.  Please see admitting history and physical for  further details.   ADMITTING DIAGNOSES:  1. Biliary colic.  2. Transaminitis.   HOSPITAL COURSE:  The patient was admitted to the hospital and given  clear liquids.  She was put on an empiric Cipro.  The following day, she  was taken to the operating room where a laparoscopic cholecystectomy was  performed.  During this, her intraoperative cholangiogram did show a  distal common bile duct stone hence her Gastroenterology was consulted.  The following day, on postoperative day #1, the patient was taken down  for an ERCP, which went well.  The common duct stone was removed without  any problems.  On postoperative day #2, the patient was tolerating a  regular diet as well as her pain was well controlled.  Her  abdomen was  soft, appropriately tender and otherwise stable for discharge home.   DISCHARGE DIAGNOSES:  1. Choledocholithiasis.  2. Cholelithiasis.  3. Biliary colic.  4. Status post laparoscopic cholecystectomy.  5. Status post endoscopic retrograde cholangiopancreatography.   DISCHARGE MEDICATIONS:  The patient is informed to resume her Percocet  5/325 one to two p.o. q.4 h. p.r.n. pain and she is also given a  prescription for postoperative pain.   She is informed to stop her Reglan 10 mg every 4 hours as needed.   DISCHARGE INSTRUCTIONS:  She is informed that she needs to return to  work in 2 weeks.  She has no dietary restrictions.  She may increase her  activity slowly and may walk up steps.  She may shower and she may not  do any heavy lifting for 2 weeks greater than 15 pounds.  She may remove  her bandages today and shower.  She is to call  our office if she has a fever greater than 101.5 nor worsening belly  pain.  Otherwise, she  needs to follow up with Dr. [name] in  approximately 2 weeks.  Otherwise, she is to come back to Dr. Marlane Hatcher  office for a repeat liver test on the date of her follow up appointment  with the surgery team.      Judy Cape, PA      Velora Heckler, MD  Electronically Signed    KEO/MEDQ  D:  06/30/2008  T:  06/30/2008  Job:  [id number]   cc:   Petra Kuba, M.D.

## 2010-08-02 NOTE — Discharge Summary (Signed)
   NAMEMARINNA, Judy Hinton                          ACCOUNT NO.:  [id number]   MEDICAL RECORD NO.:  [id number]                   PATIENT TYPE:  INP   LOCATION:  9133                                 FACILITY:  [facility]   PHYSICIAN:  [name]. Aldona Bar, M.D.                DATE OF BIRTH:  July 02, 1975   DATE OF ADMISSION:  04/05/2002  DATE OF DISCHARGE:  04/07/2002                                 DISCHARGE SUMMARY   DISCHARGE DIAGNOSES:  1. Term pregnancy, delivered 7 pound 7 ounce female infant, Apgars 9 and 10.  2. Blood type O+.   PROCEDURE:  1. Induction of labor.  2. Normal spontaneous delivery.  3. Second degree tear and repair.   SUMMARY:  This 35 year old gravida 2, para 1 was admitted at term for  induction secondary to severe low back pain after an uncomplicated  pregnancy.  At the time of admission her membranes were ruptured.  She was 3  cm dilated.  Fluid was clear.  She was augmented with Pitocin.  Requested  and received an epidural.  Progressed rapidly and subsequently delivered a  viable 7 pound 7 ounce female infant over a second degree tear.  Apgars were  noted to be 9 and 10.  The patient's postpartum course was benign.  Discharge hemoglobin 11.1, white count 14,500, platelet count 188,000.  On  the morning of January 22 she was ambulating well, tolerating a regular diet  well, having normal bowel and bladder function.  Was breast-feeding without  difficulty.  Was tolerating just Motrin for discomfort and was ready for  discharge.  Accordingly, she was given all appropriate instructions at the  time of discharge and understood all instructions well.   DISCHARGE MEDICATIONS:  1. Vitamins one daily.  2. She will use Advil at home as needed for discomfort.   FOLLOW UP:  Carried out in four weeks' time.   CONDITION ON DISCHARGE:  Improved.                                               [name]. Aldona Bar, M.D.    RMW/MEDQ  D:  04/07/2002  T:  04/07/2002  Job:  [id number]

## 2010-09-24 ENCOUNTER — Other Ambulatory Visit: Payer: Self-pay | Admitting: Obstetrics and Gynecology

## 2010-12-10 LAB — CBC
HCT: 31.8 — ABNORMAL LOW
Hemoglobin: 12.4
MCV: 92.1
Platelets: 171
RBC: 3.46 — ABNORMAL LOW
RBC: 3.87
RDW: 13.8
WBC: 15.4 — ABNORMAL HIGH

## 2010-12-10 LAB — RPR: RPR Ser Ql: NONREACTIVE

## 2014-05-11 ENCOUNTER — Encounter (HOSPITAL_BASED_OUTPATIENT_CLINIC_OR_DEPARTMENT_OTHER): Payer: Self-pay | Admitting: Emergency Medicine

## 2014-05-11 ENCOUNTER — Emergency Department (HOSPITAL_BASED_OUTPATIENT_CLINIC_OR_DEPARTMENT_OTHER)
Admission: EM | Admit: 2014-05-11 | Discharge: 2014-05-11 | Disposition: A | Discharge status: Home / Self Care | Payer: Self-pay | Attending: Emergency Medicine | Admitting: Emergency Medicine

## 2014-05-11 DIAGNOSIS — Z79899 Other long term (current) drug therapy: Secondary | ICD-10-CM | POA: Insufficient documentation

## 2014-05-11 DIAGNOSIS — S61209A Unspecified open wound of unspecified finger without damage to nail, initial encounter: Secondary | ICD-10-CM

## 2014-05-11 DIAGNOSIS — W260XXA Contact with knife, initial encounter: Secondary | ICD-10-CM | POA: Insufficient documentation

## 2014-05-11 DIAGNOSIS — S61208A Unspecified open wound of other finger without damage to nail, initial encounter: Secondary | ICD-10-CM | POA: Insufficient documentation

## 2014-05-11 DIAGNOSIS — Y9289 Other specified places as the place of occurrence of the external cause: Secondary | ICD-10-CM | POA: Insufficient documentation

## 2014-05-11 DIAGNOSIS — Y93G3 Activity, cooking and baking: Secondary | ICD-10-CM | POA: Insufficient documentation

## 2014-05-11 DIAGNOSIS — Y998 Other external cause status: Secondary | ICD-10-CM | POA: Insufficient documentation

## 2014-05-11 MED ORDER — "THROMBI-PAD 3""X3"" EX PADS"
MEDICATED_PAD | CUTANEOUS | Status: AC
  Administered 2014-05-11: 1 via TOPICAL
  Filled 2014-05-11: qty 1

## 2014-05-11 MED ORDER — "THROMBI-PAD 3""X3"" EX PADS"
1.0000 | MEDICATED_PAD | Freq: Once | CUTANEOUS | Status: AC
  Administered 2014-05-11: 1 via TOPICAL

## 2014-05-11 MED ORDER — OXYCODONE-ACETAMINOPHEN 5-325 MG PO TABS: 1.0000 | ORAL_TABLET | Freq: Four times a day (QID) | ORAL | Status: AC | PRN

## 2014-05-11 MED ORDER — OXYCODONE-ACETAMINOPHEN 5-325 MG PO TABS
1.0000 | ORAL_TABLET | Freq: Once | ORAL | Status: AC
  Administered 2014-05-11: 1 via ORAL
  Filled 2014-05-11: qty 1

## 2014-05-11 NOTE — Discharge Instructions (Signed)
Return to the ED with any concerns including increased pain, swelling, pus draining, redness extending down the finger or hand, or any other alarming symptoms

## 2014-05-11 NOTE — ED Notes (Signed)
Bleeding controlled, dressing CDI. "Pain decreased", "feel better". Steady gait, denies needs or questions. Given Rx x1. Out with family.

## 2014-05-11 NOTE — ED Provider Notes (Signed)
CSN: [id number]     Arrival date & time 05/11/14  2138 History  This chart was scribed for Ethelda ChickMartha K Linker, MD by Freida Busmaniana Omoyeni, ED Scribe. This patient was seen in room MH08/MH08 and the patient's care was started 10:50 PM.    Chief Complaint  Patient presents with  . Finger Injury    The history is provided by the patient. No language interpreter was used.   HPI Comments:  Judy Hinton is a 39 y.o. female who presents to the Emergency Department complaining of injury to the pad of her right index finger that occurred ~1 hour ago. Pt states she was cutting something with a cooking knife when she cut her finger. She reports mild throbbing pain surrounding the area. The bleeding was not controlled PTA. She has no other complaints or symptoms at this time. She denies a h/o easy bruising/platelet problems.  History reviewed. No pertinent past medical history. Past Surgical History  Procedure Laterality Date  . Cholecystectomy     History reviewed. No pertinent family history. History  Substance Use Topics  . Smoking status: Never Smoker   . Smokeless tobacco: Not on file  . Alcohol Use: No   OB History    No data available     Review of Systems  Skin: Positive for wound.  Hematological: Does not bruise/bleed easily.  All other systems reviewed and are negative.     Allergies  Review of patient's allergies indicates no known allergies.  Home Medications   Prior to Admission medications   Medication Sig Start Date End Date Taking? Authorizing Provider  escitalopram (LEXAPRO) 10 MG tablet Take 10 mg by mouth daily.   Yes Historical Provider, MD  oxyCODONE-acetaminophen (PERCOCET/ROXICET) 5-325 MG per tablet Take 1-2 tablets by mouth every 6 (six) hours as needed for severe pain. 05/11/14   Ethelda ChickMartha K Linker, MD   BP 149/89 mmHg  Pulse 84  Temp(Src) 98.3 F (36.8 C) (Oral)  Resp 18  Wt 195 lb (88.451 kg)  SpO2 100%  Vitals reviewed Physical Exam  Physical  Examination: General appearance - alert, well appearing, and in no distress Mental status - alert, oriented to person, place, and time Eyes - no scleral icterus, no conjunctival injection Chest - normal respiratory effort Neurological - alert, oriented, right fingertip with sensation intact Musculoskeletal - ttp over right first fingertip, otherwise no joint tenderness, deformity or swelling Extremities - peripheral pulses normal, no pedal edema, no clubbing or cyanosis Skin - normal coloration and turgor, no rashes, fingertip avulsion of right first finger- wound wrapped after thrombi pad applied, sensation and color intact distally, no other injuries  ED Course  Procedures   DIAGNOSTIC STUDIES:  Oxygen Saturation is 100% on RA, normal by my interpretation.    COORDINATION OF CARE:  10:53 PM Advised pt  Discussed treatment plan with pt at bedside and pt agreed to plan.  Labs Review Labs Reviewed - No data to display  Imaging Review No results found.   EKG Interpretation None      MDM   Final diagnoses:  Fingertip avulsion, initial encounter    Pt cut the tip of her finger while using a cooking knife just prior to arrival.  Pad of finger avulsed.  Distally NVI.  thrombipad applied to continual bleeding despite direct pressure.  Pt given pain medicaiton for discomfort. Discharged with strict return precautions.  Pt agreeable with plan.  I personally performed the services described in this documentation, which was scribed in  my presence. The recorded information has been reviewed and is accurate.      Ethelda Chick, MD 05/13/14 [phone]

## 2014-05-11 NOTE — ED Notes (Signed)
Patient state that she was cutting something and cut her right index finger pad. Bleeding is not controlled with pressure.

## 2014-05-11 NOTE — ED Notes (Addendum)
Thrombi pad applied to R index finger tip pad avulsion, wrapped with coban, elevated hand in finger traps, alert, NAD, calm, tolerating well, [facility]
